# Patient Record
Sex: Male | Born: 1995 | Race: White | Hispanic: No | Marital: Single | Smoking: Former smoker
Health system: Southern US, Community
[De-identification: ages and names within clinical notes are randomized; demographics above are authoritative.]

## PROBLEM LIST (undated history)

## (undated) DIAGNOSIS — J45909 Unspecified asthma, uncomplicated: Secondary | ICD-10-CM

## (undated) DIAGNOSIS — T7840XA Allergy, unspecified, initial encounter: Secondary | ICD-10-CM

## (undated) HISTORY — PX: NO PAST SURGERIES: SHX2092

## (undated) HISTORY — DX: Unspecified asthma, uncomplicated: J45.909

## (undated) HISTORY — DX: Allergy, unspecified, initial encounter: T78.40XA

---

## 2018-01-19 ENCOUNTER — Other Ambulatory Visit: Payer: Self-pay | Admitting: Internal Medicine

## 2018-01-19 DIAGNOSIS — R109 Unspecified abdominal pain: Secondary | ICD-10-CM

## 2018-01-19 DIAGNOSIS — R319 Hematuria, unspecified: Secondary | ICD-10-CM

## 2018-01-22 ENCOUNTER — Ambulatory Visit
Admission: RE | Admit: 2018-01-22 | Discharge: 2018-01-22 | Disposition: A | Discharge status: Home / Self Care | Payer: 59 | Source: Ambulatory Visit | Attending: Internal Medicine | Admitting: Internal Medicine

## 2018-01-22 DIAGNOSIS — R109 Unspecified abdominal pain: Secondary | ICD-10-CM

## 2018-01-22 DIAGNOSIS — R319 Hematuria, unspecified: Secondary | ICD-10-CM

## 2018-10-12 DIAGNOSIS — Z20828 Contact with and (suspected) exposure to other viral communicable diseases: Secondary | ICD-10-CM | POA: Diagnosis not present

## 2018-10-23 DIAGNOSIS — R030 Elevated blood-pressure reading, without diagnosis of hypertension: Secondary | ICD-10-CM | POA: Diagnosis not present

## 2018-10-23 DIAGNOSIS — R05 Cough: Secondary | ICD-10-CM | POA: Diagnosis not present

## 2018-11-11 DIAGNOSIS — Z20828 Contact with and (suspected) exposure to other viral communicable diseases: Secondary | ICD-10-CM | POA: Diagnosis not present

## 2018-11-27 DIAGNOSIS — J0100 Acute maxillary sinusitis, unspecified: Secondary | ICD-10-CM | POA: Diagnosis not present

## 2018-11-27 DIAGNOSIS — H65111 Acute and subacute allergic otitis media (mucoid) (sanguinous) (serous), right ear: Secondary | ICD-10-CM | POA: Diagnosis not present

## 2018-11-27 DIAGNOSIS — R0982 Postnasal drip: Secondary | ICD-10-CM | POA: Diagnosis not present

## 2019-01-25 ENCOUNTER — Other Ambulatory Visit: Payer: Self-pay

## 2019-01-25 DIAGNOSIS — Z20822 Contact with and (suspected) exposure to covid-19: Secondary | ICD-10-CM

## 2019-01-27 LAB — NOVEL CORONAVIRUS, NAA: SARS-CoV-2, NAA: DETECTED — AB

## 2019-02-05 DIAGNOSIS — Z20828 Contact with and (suspected) exposure to other viral communicable diseases: Secondary | ICD-10-CM | POA: Diagnosis not present

## 2019-02-07 DIAGNOSIS — Z20828 Contact with and (suspected) exposure to other viral communicable diseases: Secondary | ICD-10-CM | POA: Diagnosis not present

## 2019-02-19 DIAGNOSIS — Z20828 Contact with and (suspected) exposure to other viral communicable diseases: Secondary | ICD-10-CM | POA: Diagnosis not present

## 2019-03-16 DIAGNOSIS — Z20828 Contact with and (suspected) exposure to other viral communicable diseases: Secondary | ICD-10-CM | POA: Diagnosis not present

## 2019-05-17 DIAGNOSIS — Z20828 Contact with and (suspected) exposure to other viral communicable diseases: Secondary | ICD-10-CM | POA: Diagnosis not present

## 2019-06-28 ENCOUNTER — Ambulatory Visit: Payer: Self-pay | Attending: Internal Medicine

## 2019-06-28 DIAGNOSIS — Z23 Encounter for immunization: Secondary | ICD-10-CM

## 2019-06-28 NOTE — Progress Notes (Signed)
   Covid-19 Vaccination Clinic  Name:  Alanmichael Barmore    MRN: [id number] DOB: 1995-09-19  06/28/2019  Mr. Elting was observed post Covid-19 immunization for 15 minutes without incident. He was provided with Vaccine Information Sheet and instruction to access the V-Safe system.   Mr. Strauch was instructed to call 911 with any severe reactions post vaccine: Marland Kitchen Difficulty breathing  . Swelling of face and throat  . A fast heartbeat  . A bad rash all over body  . Dizziness and weakness   Immunizations Administered    Name Date Dose VIS Date Route   Pfizer COVID-19 Vaccine 06/28/2019  9:35 AM 0.3 mL 03/29/2019 Intramuscular   Manufacturer: [employer], [employer]   Lot: [id number]   NDC: [address]-1

## 2019-07-11 DIAGNOSIS — L03012 Cellulitis of left finger: Secondary | ICD-10-CM | POA: Diagnosis not present

## 2019-07-23 ENCOUNTER — Ambulatory Visit: Payer: Self-pay | Attending: Internal Medicine

## 2019-07-23 DIAGNOSIS — Z23 Encounter for immunization: Secondary | ICD-10-CM

## 2019-07-23 NOTE — Progress Notes (Signed)
   Covid-19 Vaccination Clinic  Name:  Calvin Guerrero    MRN: [id number] DOB: 1996-01-22  07/23/2019  Calvin Guerrero was observed post Covid-19 immunization for 15 minutes without incident. He was provided with Vaccine Information Sheet and instruction to access the V-Safe system.   Calvin Guerrero was instructed to call 911 with any severe reactions post vaccine: Marland Kitchen Difficulty breathing  . Swelling of face and throat  . A fast heartbeat  . A bad rash all over body  . Dizziness and weakness   Immunizations Administered    Name Date Dose VIS Date Route   Pfizer COVID-19 Vaccine 07/23/2019  1:05 PM 0.3 mL 03/29/2019 Intramuscular   Manufacturer: [employer], [employer]   Lot: [id number]   NDC: [address]-2

## 2019-10-17 DIAGNOSIS — Z03818 Encounter for observation for suspected exposure to other biological agents ruled out: Secondary | ICD-10-CM | POA: Diagnosis not present

## 2019-10-17 DIAGNOSIS — Z20822 Contact with and (suspected) exposure to covid-19: Secondary | ICD-10-CM | POA: Diagnosis not present

## 2019-11-19 DIAGNOSIS — A545 Gonococcal pharyngitis: Secondary | ICD-10-CM | POA: Diagnosis not present

## 2019-12-10 ENCOUNTER — Ambulatory Visit: Payer: Self-pay | Admitting: Nurse Practitioner

## 2019-12-17 ENCOUNTER — Encounter: Payer: Self-pay | Admitting: Physician Assistant

## 2019-12-17 ENCOUNTER — Encounter: Payer: Self-pay | Admitting: Gastroenterology

## 2019-12-17 ENCOUNTER — Other Ambulatory Visit: Payer: Self-pay

## 2019-12-17 ENCOUNTER — Ambulatory Visit (INDEPENDENT_AMBULATORY_CARE_PROVIDER_SITE_OTHER): Payer: BC Managed Care – PPO | Admitting: Physician Assistant

## 2019-12-17 VITALS — BP 126/70 | HR 84 | Temp 98.0°F | Ht 72.5 in | Wt 225.0 lb

## 2019-12-17 DIAGNOSIS — R194 Change in bowel habit: Secondary | ICD-10-CM

## 2019-12-17 DIAGNOSIS — Z114 Encounter for screening for human immunodeficiency virus [HIV]: Secondary | ICD-10-CM | POA: Diagnosis not present

## 2019-12-17 DIAGNOSIS — R41840 Attention and concentration deficit: Secondary | ICD-10-CM | POA: Diagnosis not present

## 2019-12-17 DIAGNOSIS — Z1159 Encounter for screening for other viral diseases: Secondary | ICD-10-CM | POA: Diagnosis not present

## 2019-12-17 DIAGNOSIS — Z23 Encounter for immunization: Secondary | ICD-10-CM | POA: Diagnosis not present

## 2019-12-17 DIAGNOSIS — R1084 Generalized abdominal pain: Secondary | ICD-10-CM | POA: Diagnosis not present

## 2019-12-17 DIAGNOSIS — F419 Anxiety disorder, unspecified: Secondary | ICD-10-CM

## 2019-12-17 NOTE — Progress Notes (Signed)
Calvin Guerrero is a 24 y.o. male is here to establish care.  I acted as a Neurosurgeon for [employer], PA-C Corky Mull, LPN   History of Present Illness:   Chief Complaint  Patient presents with  . Establish Care  . Anxiety  . Depression  . Abdominal Pain    HPI   Anxiety/Depression Pt c/o issues for the past 3-4 yrs and has worsened in the past months due to pandemic and subsequent isolation. Pt says he is having a lot of anxiety, feels scatter brain. Has concerns about ADD and ADHD -- has never been formally diagnosed. Also endorses depression, states that he is sad a lot lately and has self doubt. Struggling to figure out what he wants to do with life. Denies SI/HI.  Has never been on medication or seen a therapist.   Abdominal pain Pt c/o upper abdominal pain for the past 7-8 yrs, worse past few months. Pt has been trying to figure out what foods bother him. Pt is having bouts of diarrhea. Definitely feels as though he has lactose intolerance. Denies abdominal pain that wakes him up, rectal bleeding, nausea, family history of colon cancer, unintentional weight loss.    Health Maintenance Due  Topic Date Due  . Hepatitis C Screening  Never done  . HIV Screening  Never done  . TETANUS/TDAP  Never done    Past Medical History:  Diagnosis Date  . Allergy   . Asthma    as a child     Social History   Tobacco Use  . Smoking status: Former Smoker    Types: Cigarettes  . Smokeless tobacco: Never Used  . Tobacco comment: quit 04/2018  Vaping Use  . Vaping Use: Never used  Substance Use Topics  . Alcohol use: Yes    Comment: socially  . Drug use: Yes    Frequency: 4.0 times per week    Types: Marijuana    History reviewed. No pertinent surgical history.  Family History  Problem Relation Age of Onset  . Asthma Mother   . Thyroid cancer Mother   . Gestational diabetes Mother   . Depression Mother   . Hearing loss Father   . Depression Sister   . Ovarian  cancer Paternal Grandmother   . Colon cancer Neg Hx   . Prostate cancer Neg Hx   . Breast cancer Neg Hx     PMHx, SurgHx, SocialHx, FamHx, Medications, and Allergies were reviewed in the Visit Navigator and updated as appropriate.   There are no problems to display for this patient.   Social History   Tobacco Use  . Smoking status: Former Smoker    Types: Cigarettes  . Smokeless tobacco: Never Used  . Tobacco comment: quit 04/2018  Vaping Use  . Vaping Use: Never used  Substance Use Topics  . Alcohol use: Yes    Comment: socially  . Drug use: Yes    Frequency: 4.0 times per week    Types: Marijuana    Current Medications and Allergies:    Current Outpatient Medications:  .  bismuth subsalicylate (PEPTO BISMOL) 262 MG chewable tablet, Chew 524 mg by mouth as needed., Disp: , Rfl:  .  cetirizine (ZYRTEC) 10 MG tablet, Take by mouth., Disp: , Rfl:  .  DESCOVY 200-25 MG tablet, Take 1 tablet by mouth daily., Disp: , Rfl:  .  naproxen sodium (ALEVE) 220 MG tablet, Take 220 mg by mouth daily as needed., Disp: , Rfl:  No Known Allergies  Review of Systems   ROS Negative unless otherwise specified per HPI.  Vitals:   Vitals:   12/17/19 0917  BP: 126/70  Pulse: 84  Temp: 98 F (36.7 C)  TempSrc: Temporal  SpO2: 98%  Weight: 225 lb (102.1 kg)  Height: 6' 0.5" (1.842 m)     Body mass index is 30.1 kg/m.   Physical Exam:    Physical Exam Vitals and nursing note reviewed.  Constitutional:      General: He is not in acute distress.    Appearance: He is well-developed. He is not ill-appearing or toxic-appearing.  Cardiovascular:     Rate and Rhythm: Normal rate and regular rhythm.     Pulses: Normal pulses.     Heart sounds: Normal heart sounds, S1 normal and S2 normal.     Comments: No LE edema Pulmonary:     Effort: Pulmonary effort is normal.     Breath sounds: Normal breath sounds.  Abdominal:     General: Abdomen is flat. Bowel sounds are normal.      Palpations: Abdomen is soft.     Tenderness: There is no abdominal tenderness.  Skin:    General: Skin is warm and dry.  Neurological:     Mental Status: He is alert.     GCS: GCS eye subscore is 4. GCS verbal subscore is 5. GCS motor subscore is 6.  Psychiatric:        Speech: Speech normal.        Behavior: Behavior normal. Behavior is cooperative.      Assessment and Plan:    Jerrel was seen today for establish care, anxiety, depression and abdominal pain.  Diagnoses and all orders for this visit:  Generalized abdominal pain; Bowel habit changes Suspect possible IBS and dietary intolerances however cannot r/o other causes -- will refer to GI for further evaluation and management. -     CBC with Differential/Platelet; Future -     Comprehensive metabolic panel; Future -     TSH; Future -     Ambulatory referral to Gastroenterology  Anxiety; Attention deficit Suspect possible undiagnosed ADD/ADHD. Will wait to treat anxiety/depression depending on ADD/ADHD results. Will send for evaluation, consider [facility] if therapist unable to evaluate for ADD/ADHD. -     Ambulatory referral to Psychology -     CBC with Differential/Platelet; Future -     Comprehensive metabolic panel; Future -     TSH; Future -     Ambulatory referral to Psychology  Encounter for screening for other viral diseases -     Hepatitis C antibody; Future  Screening for HIV (human immunodeficiency virus) -     HIV Antibody (routine testing w rflx); Future   . Reviewed expectations re: course of current medical issues. . Discussed self-management of symptoms. . Outlined signs and symptoms indicating need for more acute intervention. . Patient verbalized understanding and all questions were answered. . See orders for this visit as documented in the electronic medical record. . Patient received an After Visit Summary.  CMA or LPN served as scribe during this visit. History,  Physical, and Plan performed by medical provider. The above documentation has been reviewed and is accurate and complete.   Jarold Motto, PA-C [place] [place] 12/17/2019  Follow-up: No follow-ups on file.

## 2019-12-17 NOTE — Patient Instructions (Signed)
It was great to see you!  We will update you with your lab results.  I will have [facility] reach out to you to schedule talk therapy -- with Hal Neer.  Referral for GI will be sent.  Take care,  Jarold Motto PA-C

## 2019-12-17 NOTE — Addendum Note (Signed)
Addended by: Jimmye Norman on: 12/17/2019 10:44 AM   Modules accepted: Orders

## 2019-12-19 LAB — COMPREHENSIVE METABOLIC PANEL
AG Ratio: 1.7 (calc) (ref 1.0–2.5)
ALT: 23 U/L (ref 9–46)
AST: 20 U/L (ref 10–40)
Albumin: 4.7 g/dL (ref 3.6–5.1)
Alkaline phosphatase (APISO): 64 U/L (ref 36–130)
BUN: 12 mg/dL (ref 7–25)
CO2: 24 mmol/L (ref 20–32)
Calcium: 9.6 mg/dL (ref 8.6–10.3)
Chloride: 105 mmol/L (ref 98–110)
Creat: 0.77 mg/dL (ref 0.60–1.35)
Globulin: 2.7 g/dL (calc) (ref 1.9–3.7)
Glucose, Bld: 97 mg/dL (ref 65–99)
Potassium: 4.1 mmol/L (ref 3.5–5.3)
Sodium: 140 mmol/L (ref 135–146)
Total Bilirubin: 0.7 mg/dL (ref 0.2–1.2)
Total Protein: 7.4 g/dL (ref 6.1–8.1)

## 2019-12-19 LAB — CBC WITH DIFFERENTIAL/PLATELET
Absolute Monocytes: 432 cells/uL (ref 200–950)
Basophils Absolute: 62 cells/uL (ref 0–200)
Basophils Relative: 1.3 %
Eosinophils Absolute: 91 cells/uL (ref 15–500)
Eosinophils Relative: 1.9 %
HCT: 44.8 % (ref 38.5–50.0)
Hemoglobin: 15.2 g/dL (ref 13.2–17.1)
Lymphs Abs: 1560 cells/uL (ref 850–3900)
MCH: 30.2 pg (ref 27.0–33.0)
MCHC: 33.9 g/dL (ref 32.0–36.0)
MCV: 88.9 fL (ref 80.0–100.0)
MPV: 9.8 fL (ref 7.5–12.5)
Monocytes Relative: 9 %
Neutro Abs: 2654 cells/uL (ref 1500–7800)
Neutrophils Relative %: 55.3 %
Platelets: 236 10*3/uL (ref 140–400)
RBC: 5.04 10*6/uL (ref 4.20–5.80)
RDW: 13 % (ref 11.0–15.0)
Total Lymphocyte: 32.5 %
WBC: 4.8 10*3/uL (ref 3.8–10.8)

## 2019-12-19 LAB — HEPATITIS C ANTIBODY
Hepatitis C Ab: NONREACTIVE
SIGNAL TO CUT-OFF: 0.02 (ref <1.00)

## 2019-12-19 LAB — HIV ANTIBODY (ROUTINE TESTING W REFLEX): HIV 1&2 Ab, 4th Generation: NONREACTIVE

## 2019-12-19 LAB — TSH: TSH: 2.25 mIU/L (ref 0.40–4.50)

## 2019-12-25 DIAGNOSIS — Z20822 Contact with and (suspected) exposure to covid-19: Secondary | ICD-10-CM | POA: Diagnosis not present

## 2020-01-12 DIAGNOSIS — Z20822 Contact with and (suspected) exposure to covid-19: Secondary | ICD-10-CM | POA: Diagnosis not present

## 2020-01-28 ENCOUNTER — Ambulatory Visit (INDEPENDENT_AMBULATORY_CARE_PROVIDER_SITE_OTHER): Payer: BC Managed Care – PPO | Admitting: Gastroenterology

## 2020-01-28 ENCOUNTER — Other Ambulatory Visit (INDEPENDENT_AMBULATORY_CARE_PROVIDER_SITE_OTHER): Payer: BC Managed Care – PPO

## 2020-01-28 ENCOUNTER — Encounter: Payer: Self-pay | Admitting: Gastroenterology

## 2020-01-28 VITALS — BP 128/74 | HR 72 | Ht 72.5 in | Wt 227.6 lb

## 2020-01-28 DIAGNOSIS — R194 Change in bowel habit: Secondary | ICD-10-CM

## 2020-01-28 DIAGNOSIS — R1084 Generalized abdominal pain: Secondary | ICD-10-CM | POA: Diagnosis not present

## 2020-01-28 DIAGNOSIS — R142 Eructation: Secondary | ICD-10-CM | POA: Diagnosis not present

## 2020-01-28 LAB — C-REACTIVE PROTEIN: CRP: <1 mg/dL (ref 0.5–20.0)

## 2020-01-28 LAB — SEDIMENTATION RATE: Sed Rate: 16 mm/hr — ABNORMAL HIGH (ref 0–15)

## 2020-01-28 MED ORDER — HYOSCYAMINE SULFATE 0.125 MG SL SUBL: 0.1250 mg | SUBLINGUAL_TABLET | Freq: Four times a day (QID) | SUBLINGUAL | 1 refills | Status: AC | PRN

## 2020-01-28 MED ORDER — PANTOPRAZOLE SODIUM 40 MG PO TBEC: 40.0000 mg | DELAYED_RELEASE_TABLET | Freq: Every day | ORAL | 5 refills | Status: DC

## 2020-01-28 NOTE — Progress Notes (Signed)
01/28/2020 Calvin Guerrero [id number] 1995/12/02   HISTORY OF PRESENT ILLNESS: This is a 24 year old male who is new to our office.  He has been referred here by his PCP, Jarold Motto, PA-C, for evaluation regarding generalized abdominal pain and altered bowel habits.  He tells me that these symptoms have been going on for a few years.  He thinks they are related to stress and anxiety and he has kind of diagnosed himself with IBS.  He says that he does not have the best diet so he thinks a lot of times that it is related to his less than optimal eating habits.  He says that about 25% of the time he has diarrhea or loose stools.  He says that on average he has about 2-3 bowel movements a day.  Depending what he eats sometimes he has the urge to move his bowels shortly after eating.  He reports generalized abdominal discomfort and gassy sensation, but no fixed abdominal pain.  Has occasional constipation.  Denies rectal bleeding.  Uses Pepto-Bismol as needed.  No family history of GI diseases to his knowledge.  Had a recent CBC, CMP, TSH that were normal.   Past Medical History:  Diagnosis Date  . Allergy   . Asthma    as a child   Past Surgical History:  Procedure Laterality Date  . NO PAST SURGERIES      reports that he has quit smoking. His smoking use included cigarettes. He has never used smokeless tobacco. He reports current alcohol use. He reports current drug use. Frequency: 4.00 times per week. Drug: Marijuana. family history includes Asthma in his mother; Depression in his mother and sister; Gestational diabetes in his mother; Hearing loss in his father; Ovarian cancer in his paternal grandmother; Thyroid cancer in his mother. No Known Allergies    Outpatient Encounter Medications as of 01/28/2020  Medication Sig  . bismuth subsalicylate (PEPTO BISMOL) 262 MG chewable tablet Chew 524 mg by mouth as needed.  . cetirizine (ZYRTEC) 10 MG tablet Take by mouth.  . DESCOVY 200-25 MG  tablet Take 1 tablet by mouth daily.  . naproxen sodium (ALEVE) 220 MG tablet Take 220 mg by mouth daily as needed.   No facility-administered encounter medications on file as of 01/28/2020.    REVIEW OF SYSTEMS  : All other systems reviewed and negative except where noted in the History of Present Illness.  PHYSICAL EXAM: BP 128/74   Pulse 72   Ht 6' 0.5" (1.842 m)   Wt 227 lb 9.6 oz (103.2 kg)   BMI 30.44 kg/m  General: Well developed white male in no acute distress Head: Normocephalic and atraumatic Eyes:  Sclerae anicteric, conjunctiva pink. Ears: Normal auditory acuity Lungs: Clear throughout to auscultation; no W/R/R. Heart: Regular rate and rhythm; no M/R/G. Abdomen: Soft, non-distended.  BS present.  Non-tender. Musculoskeletal: Symmetrical with no gross deformities  Skin: No lesions on visible extremities Extremities: No edema  Neurological: Alert oriented x 4, grossly non-focal Psychological:  Alert and cooperative. Normal mood and affect  ASSESSMENT AND PLAN: *24 year old male with longstanding complaints of generalized abdominal discomfort typically after eating, altered bowel habits with sometimes loose stools/diarrhea, occasional constipation.  Also reports belching and sensation of reflux at times as well.  Has really self-diagnosed with IBS as he thinks a lot of this is related to stress and anxiety.  This very well may be the case.  He does not have any red flag symptoms.  Recent  CBC, CMP, TSH were normal.  We will check celiac labs, sed rate, CRP.  He will try lactose-free diet and this literature was given to him.  We will start pantoprazole 40 mg daily for possible acid reflux related issues and will start Levsin as needed for abdominal pain/cramping.  Both those prescriptions were sent to his pharmacy.  He will follow-up with me in about 4 to 5 weeks at which time we will reassess need for endoscopic procedures, etc.   CC:  Jarold Motto, PA

## 2020-01-28 NOTE — Patient Instructions (Addendum)
If you are age 24 or older, your body mass index should be between 23-30. Your Body mass index is 30.44 kg/m. If this is out of the aforementioned range listed, please consider follow up with your Primary Care Provider.  If you are age 29 or younger, your body mass index should be between 19-25. Your Body mass index is 30.44 kg/m. If this is out of the aformentioned range listed, please consider follow up with your Primary Care Provider.   Your provider has requested that you go to the basement level for lab work before leaving today. Press "B" on the elevator. The lab is located at the first door on the left as you exit the elevator.  We have sent the following medications to your pharmacy for you to pick up at your convenience: Pantoprazole 40 mg daily 30-60 minutes before breakfast. Levsin 0.125 mg SL every 6 hours as needed.   Lactose Free Diet handout provided.

## 2020-01-29 LAB — TISSUE TRANSGLUTAMINASE ABS,IGG,IGA
(tTG) Ab, IgA: <1 U/mL
(tTG) Ab, IgG: <1 U/mL

## 2020-01-29 LAB — IGA: Immunoglobulin A: 302 mg/dL (ref 47–310)

## 2020-02-03 NOTE — Progress Notes (Signed)
Agree with the assessment and plan as outlined by Doug Sou, PA-C. If further evaluation unrevealing, and no response ot conservative measures as outlined, will plan for endoscopic evaluation.   Mavryk Pino, DO, [facility]

## 2020-03-03 ENCOUNTER — Encounter: Payer: Self-pay | Admitting: Gastroenterology

## 2020-03-03 ENCOUNTER — Ambulatory Visit (INDEPENDENT_AMBULATORY_CARE_PROVIDER_SITE_OTHER): Payer: BC Managed Care – PPO | Admitting: Gastroenterology

## 2020-03-03 VITALS — BP 112/60 | HR 80 | Ht 72.0 in | Wt 228.4 lb

## 2020-03-03 DIAGNOSIS — R194 Change in bowel habit: Secondary | ICD-10-CM | POA: Diagnosis not present

## 2020-03-03 DIAGNOSIS — R1084 Generalized abdominal pain: Secondary | ICD-10-CM

## 2020-03-03 DIAGNOSIS — R142 Eructation: Secondary | ICD-10-CM | POA: Diagnosis not present

## 2020-03-03 NOTE — Progress Notes (Signed)
03/03/2020 Calvin Guerrero [id number] [birth year]   HISTORY OF PRESENT ILLNESS: This is a 24 year old male who is here for follow-up of his reflux and suspected IBS symptoms.  He was seen by me on October 12 for complaints of generalized abdominal pain and altered bowel habits as well as acid reflux type symptoms.  He had self diagnosed with IBS.  We ran several labs including celiac labs, sed rate, CRP and he had already undergone CBC, CMP, TSH.  All of those were normal except for a sed rate of 16.  He was placed on pantoprazole 40 mg daily and Levsin as needed.  He returns here today for follow-up.  He says that he is doing much better.  Reports only having about 1-2 bowel movements a day.  He has not completely gone lactose-free, but definitely limits his dairy intake.  His abdominal pains are also much better and his reflux has significantly improved.   Past Medical History:  Diagnosis Date  . Allergy   . Asthma    as a child   Past Surgical History:  Procedure Laterality Date  . NO PAST SURGERIES      reports that he has quit smoking. His smoking use included cigarettes. He has never used smokeless tobacco. He reports current alcohol use. He reports current drug use. Frequency: 4.00 times per week. Drug: Marijuana. family history includes Asthma in his mother; Depression in his mother and sister; Gestational diabetes in his mother; Hearing loss in his father; Ovarian cancer in his paternal grandmother; Thyroid cancer in his mother. No Known Allergies    Outpatient Encounter Medications as of 03/03/2020  Medication Sig  . bismuth subsalicylate (PEPTO BISMOL) 262 MG chewable tablet Chew 524 mg by mouth as needed.  . cetirizine (ZYRTEC) 10 MG tablet Take by mouth.  . DESCOVY 200-25 MG tablet Take 1 tablet by mouth daily.  . hyoscyamine (LEVSIN SL) 0.125 MG SL tablet Place 1 tablet (0.125 mg total) under the tongue every 6 (six) hours as needed.  . naproxen sodium (ALEVE) 220 MG  tablet Take 220 mg by mouth daily as needed.  . pantoprazole (PROTONIX) 40 MG tablet Take 1 tablet (40 mg total) by mouth daily.   No facility-administered encounter medications on file as of 03/03/2020.     REVIEW OF SYSTEMS  : All other systems reviewed and negative except where noted in the History of Present Illness.   PHYSICAL EXAM: Ht 6' (1.829 m) Comment: height measured without shoes  Wt 228 lb 6 oz (103.6 kg)   BMI 30.97 kg/m  General: Well developed white male in no acute distress Head: Normocephalic and atraumatic Eyes:  Sclerae anicteric, conjunctiva pink. Ears: Normal auditory acuity Lungs: Clear throughout to auscultation; no W/R/R. Heart: Regular rate and rhythm; no M/R/G. Abdomen: Soft, non-distended.  BS present.  Non-tender. Musculoskeletal: Symmetrical with no gross deformities  Skin: No lesions on visible extremities Extremities: No edema  Neurological: Alert oriented x 4, grossly non-focal Psychological:  Alert and cooperative. Normal mood and affect  ASSESSMENT AND PLAN: *24 year old male with longstanding complaints of generalized abdominal discomfort typically after eating, altered bowel habits with sometimes loose stools/diarrhea, occasional constipation.  Also reported belching and sensation reflux at times.  Suspected diagnoses of IBS and GERD.  All of his labs were normal except a sed rate elevated at only 16.  He was started on pantoprazole 40 mg daily and Levsin as needed.  He reports doing much better on this regimen.  Patient is that he is satisfied on his regimen at this time and would like to continue it longer and declines endoscopic evaluation for now.  He will return prn if symptoms worsen or other issues occur, otherwise we will see him in one year for refills of his medications.   CC:  Jarold Motto, [place]

## 2020-03-03 NOTE — Progress Notes (Signed)
Agree with the assessment and plan as outlined by Jessica Zehr, PA-C. ? ?Lilya Smitherman, DO, FACG ? ?

## 2020-03-03 NOTE — Patient Instructions (Signed)
Please follow up as needed 

## 2020-03-06 ENCOUNTER — Encounter: Payer: BC Managed Care – PPO | Admitting: Physician Assistant

## 2020-03-21 DIAGNOSIS — Z20822 Contact with and (suspected) exposure to covid-19: Secondary | ICD-10-CM | POA: Diagnosis not present

## 2020-03-24 IMAGING — CT CT ABD-PELV W/O CM
2 of 4 series · 13 of 46 positions shown, 15 images · non-contrast
Comparison: None.

CLINICAL DATA: 22-year-old male with bilateral groin pain and left
flank pain. Microscopic hematuria. Initial encounter.

EXAM:
CT ABDOMEN AND PELVIS WITHOUT CONTRAST
TECHNIQUE: Multidetector CT imaging of the abdomen and pelvis was performed
following the standard protocol without IV contrast.

[Series 2: renal stone 5.00 br40 s3 ax · axial · 0.65mm/px · z∈[+1223,+1668]mm · 10 of 107 slices shown, 12 images]
[im 9/107  soft-tissue]
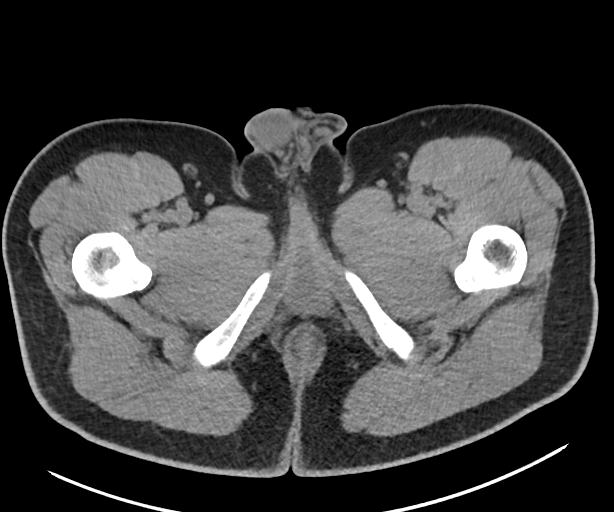
[im 9/107  bone]
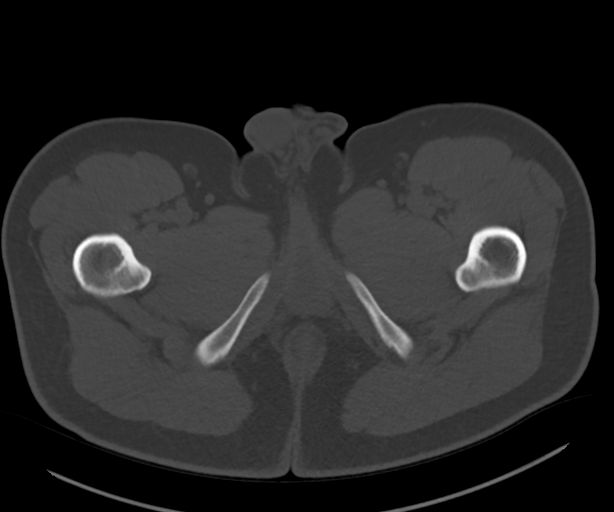
[im 17/107  soft-tissue]
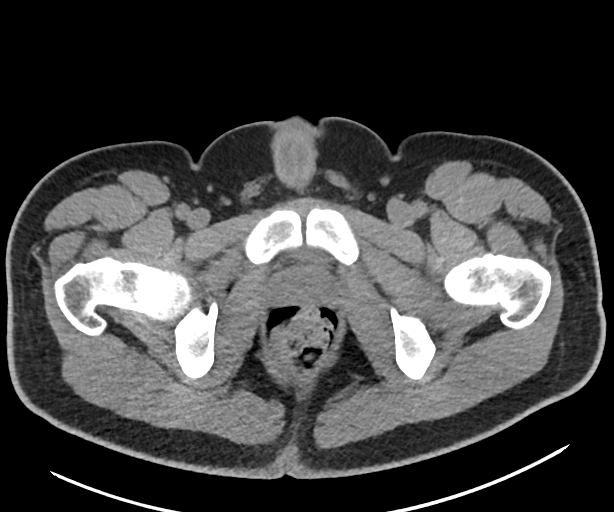
[im 30/107  soft-tissue]
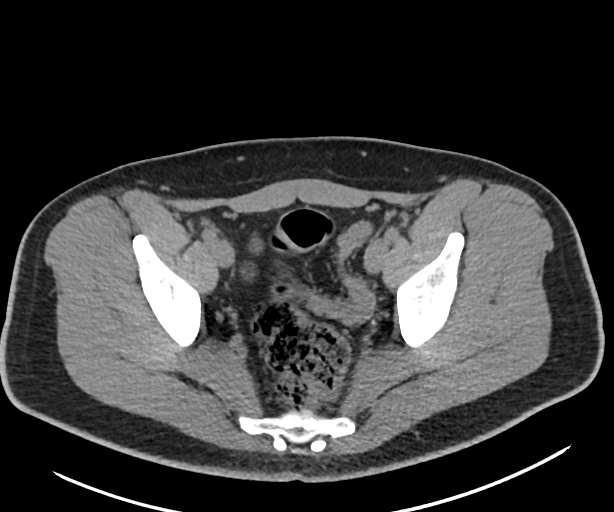
[im 39/107  soft-tissue]
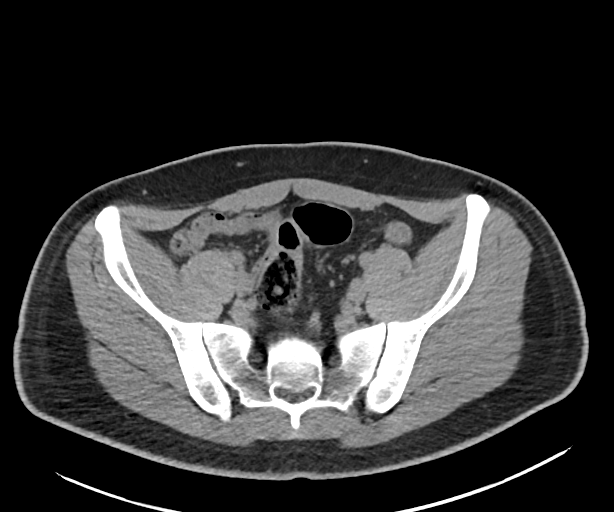
[im 47/107  soft-tissue]
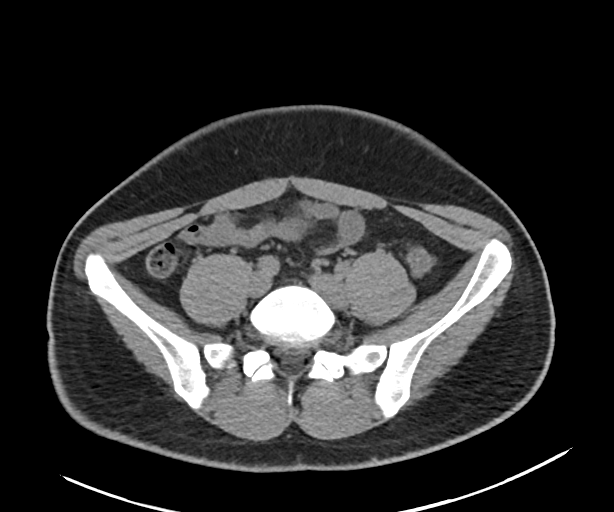
[im 60/107  soft-tissue]
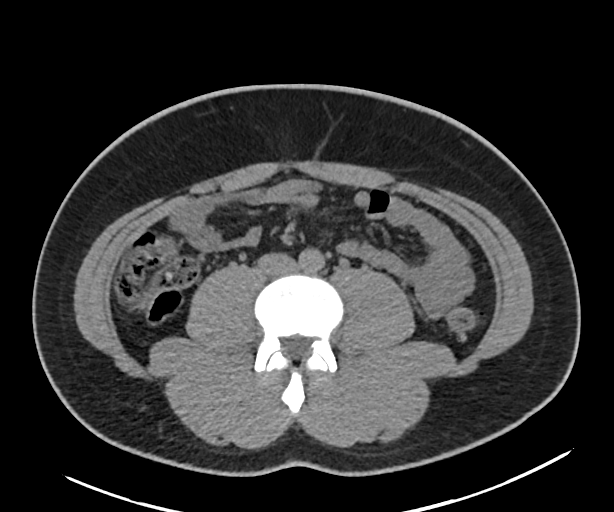
[im 68/107  soft-tissue]
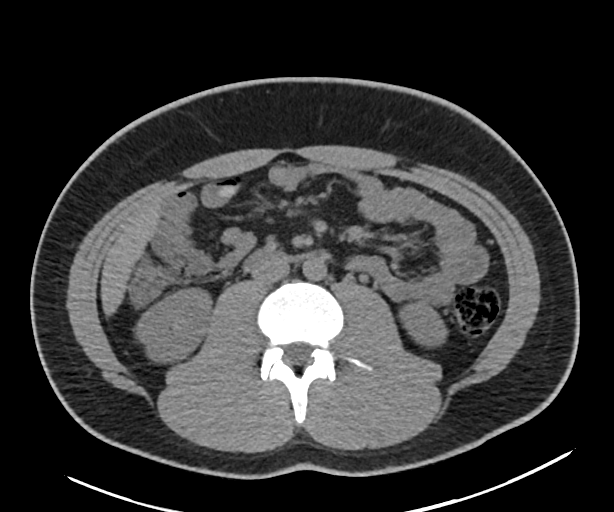
[im 81/107  soft-tissue]
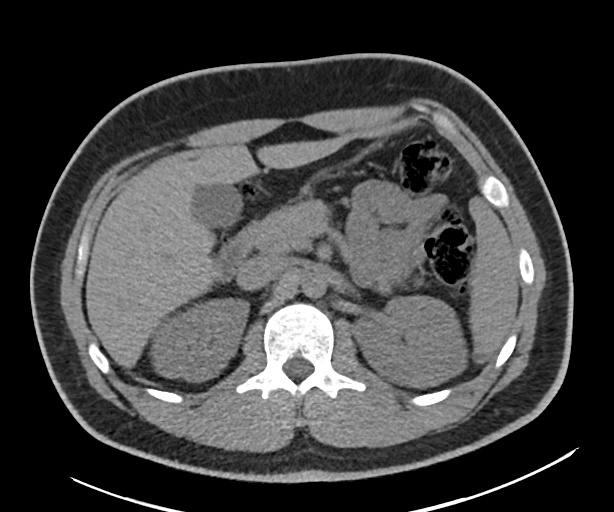
[im 90/107  soft-tissue]
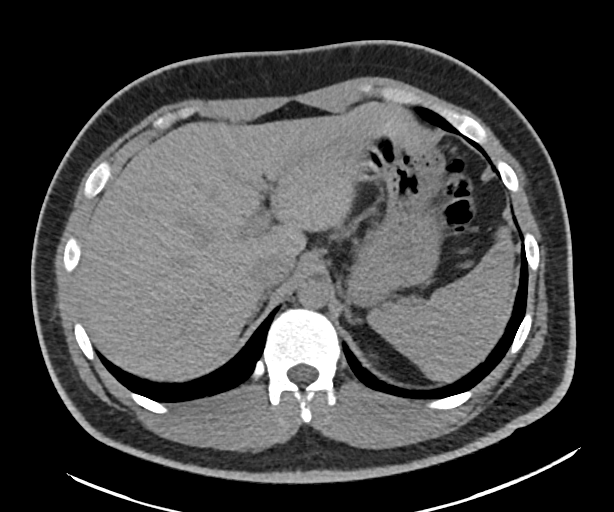
[im 90/107  bone]
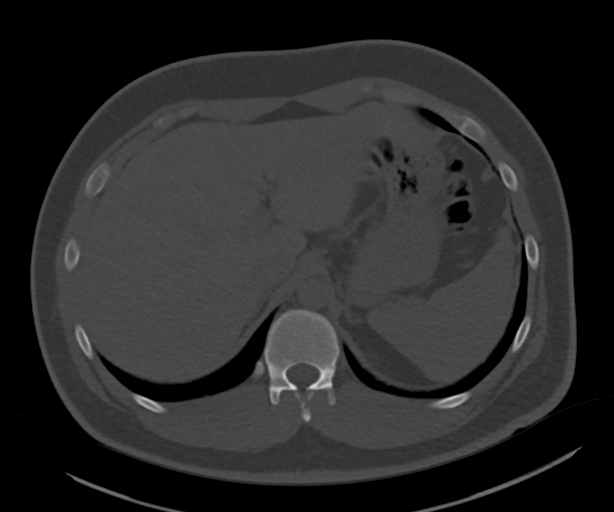
[im 98/107  soft-tissue]
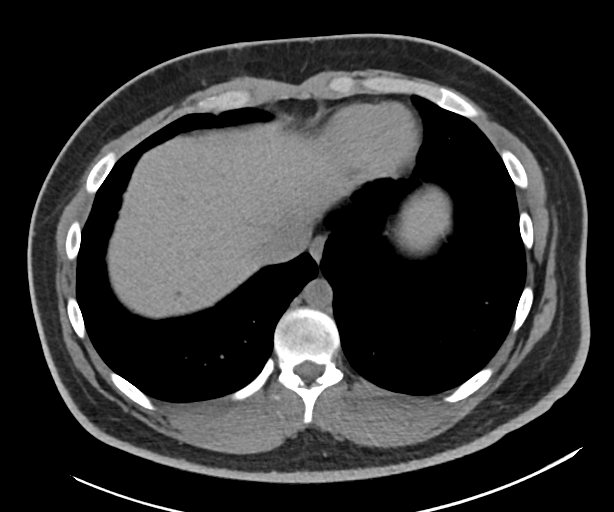

[Series 6: renal stone 2.00 br40 s3 cor · coronal · 0.78mm/px · 3 of 172 slices shown]
[im 58/172  soft-tissue]
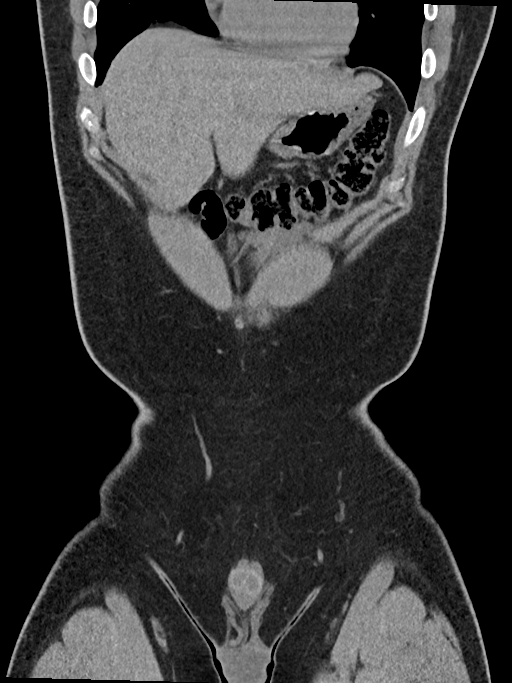
[im 77/172  soft-tissue]
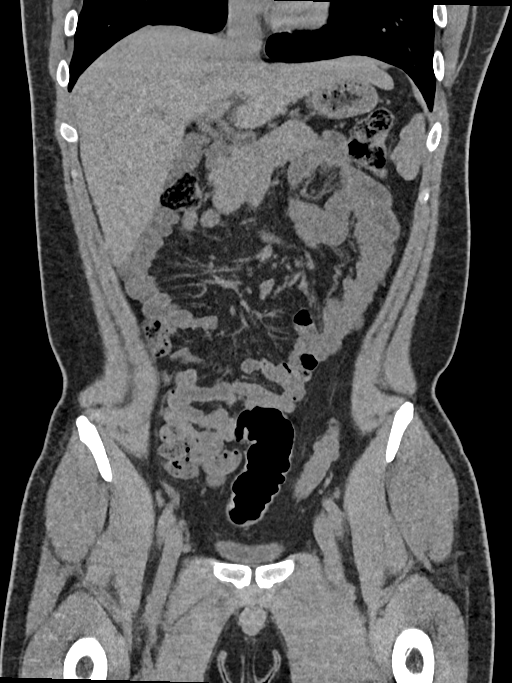
[im 96/172  soft-tissue]
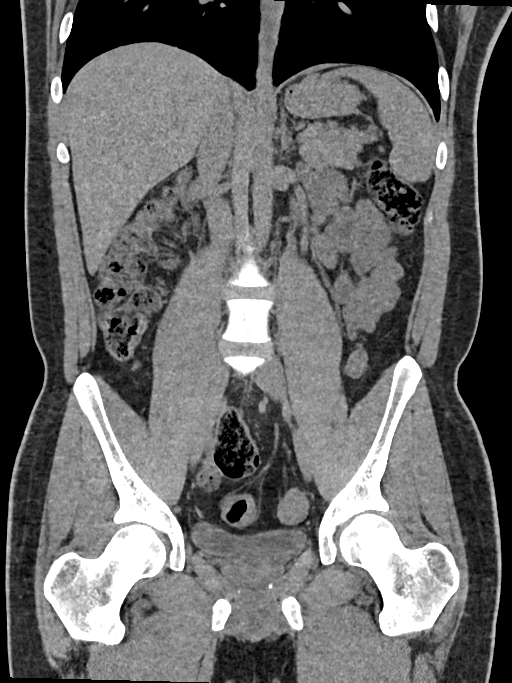

[13 of 46 positions shown; findings below may reference images not displayed]

FINDINGS: Lower chest: No worrisome lung base abnormality. Heart size within
normal limits.

Hepatobiliary: Slightly elongated liver spanning over 18.7 cm.
Taking into account limitation by non contrast imaging, no worrisome
hepatic lesion. No calcified gallstones or CT evidence of
gallbladder inflammation.

Pancreas: Taking into account limitation by non contrast imaging, no
worrisome pancreatic mass or inflammation.

Spleen: Taking into account limitation by non contrast imaging, no
splenic mass or enlargement.

Adrenals/Urinary Tract: 2 x 1 mm calcification left aspect of the
pelvis (series 2, image 78 and series 6, image 109). Question tiny
distal left ureteral calculus without proximal obstruction as versus
tiny phlebolith. This is located 5.8 cm proximal to the left
ureteral vesical junction.

Taking into account limitation by non contrast imaging, no worrisome
renal or adrenal lesion. Artifact versus tiny left upper pole
nonobstructing renal calculi.

Decompressed urinary bladder without gross abnormality.

Stomach/Bowel: Portions of stomach, small bowel and colon under
distended. No extraluminal bowel inflammatory process detected.
Specifically, no inflammation surrounds the appendix.

Vascular/Lymphatic: No abdominal aortic aneurysm. Scattered small
lymph nodes.

Reproductive: Coarse calcification left aspect of prostate gland.

Other: No free air or bowel containing hernia.

Musculoskeletal: No worrisome osseous abnormality.
IMPRESSION: 1. 2 x 1 mm calcification left aspect of the pelvis (series 2, image
78 and series 6, image 109). Question tiny distal left ureteral
calculus without proximal obstruction as versus tiny phlebolith.
This is located 5.8 cm proximal to the left ureteral vesical
junction.
2. Artifact versus tiny left upper pole nonobstructing renal calculi
(series 8, image 141 and series 6, image 110).
3. Slightly elongated liver spanning over 18.7 cm.

## 2020-05-19 ENCOUNTER — Ambulatory Visit: Payer: BC Managed Care – PPO | Admitting: Physician Assistant

## 2020-05-22 ENCOUNTER — Ambulatory Visit: Payer: BC Managed Care – PPO | Admitting: Physician Assistant

## 2020-05-27 ENCOUNTER — Other Ambulatory Visit: Payer: Self-pay | Admitting: Gastroenterology

## 2020-06-02 ENCOUNTER — Other Ambulatory Visit (HOSPITAL_COMMUNITY)
Admission: RE | Admit: 2020-06-02 | Discharge: 2020-06-02 | Disposition: A | Discharge status: Home / Self Care | Payer: 59 | Source: Ambulatory Visit | Attending: Physician Assistant | Admitting: Physician Assistant

## 2020-06-02 ENCOUNTER — Other Ambulatory Visit: Payer: Self-pay

## 2020-06-02 ENCOUNTER — Encounter: Payer: Self-pay | Admitting: Physician Assistant

## 2020-06-02 ENCOUNTER — Ambulatory Visit: Payer: 59 | Admitting: Physician Assistant

## 2020-06-02 VITALS — BP 116/60 | HR 89 | Temp 98.4°F | Ht 72.0 in | Wt 220.2 lb

## 2020-06-02 DIAGNOSIS — F321 Major depressive disorder, single episode, moderate: Secondary | ICD-10-CM

## 2020-06-02 DIAGNOSIS — R41840 Attention and concentration deficit: Secondary | ICD-10-CM | POA: Diagnosis not present

## 2020-06-02 DIAGNOSIS — F419 Anxiety disorder, unspecified: Secondary | ICD-10-CM

## 2020-06-02 DIAGNOSIS — Z7251 High risk heterosexual behavior: Secondary | ICD-10-CM

## 2020-06-02 MED ORDER — EMTRICITABINE-TENOFOVIR DF 200-300 MG PO TABS: 1.0000 | ORAL_TABLET | Freq: Every day | ORAL | 0 refills | Status: DC

## 2020-06-02 MED ORDER — FLUOXETINE HCL 10 MG PO CAPS: 10.0000 mg | ORAL_CAPSULE | Freq: Every day | ORAL | 1 refills | Status: DC

## 2020-06-02 NOTE — Progress Notes (Signed)
Calvin Guerrero is a 25 y.o. male here for a new problem.  I acted as a Neurosurgeon for [employer], PA-C Corky Mull, LPN   History of Present Illness:   Chief Complaint  Patient presents with  . Anxiety  . Depression    HPI   Anxiety / Depression Pt c/o increase in anxiety and depression for the past 2-3 months. Depression worse than anxiety especially since the pandemic. Pt would like to see a therapist. Has never been truly diagnosed and never on medication. Denies SI/HI. Has concerns for underlying ADHD and would like to pursue testing for this. Seasonal depression is a concern for him as well.  Anxiety is increased during the day typically. Has had poor eating habits. Has never been on medications. Has had some suicidal thoughts last week, prior to vacation. Denies plan.  Best friend Idalia Needle is biggest support person right now.  Depression screen [facility] 2/9 06/02/2020 12/17/2019  Decreased Interest 2 1  Down, Depressed, Hopeless 3 1  PHQ - 2 Score 5 2  Altered sleeping 1 1  Tired, decreased energy 2 2  Change in appetite 3 3  Feeling bad or failure about yourself  2 1  Trouble concentrating 3 1  Moving slowly or fidgety/restless 0 0  Suicidal thoughts 1 0  PHQ-9 Score 17 10  Difficult doing work/chores Very difficult Somewhat difficult    PREP use Has used Truvada in the past and did well with this but his formulary is requiring [place]. He trialed this but it caused worsening GI symptoms. He would like to go back to Truvada. Unfortunately we do not have his baseline PREP labs to review together today.   Past Medical History:  Diagnosis Date  . Allergy   . Asthma    as a child     Social History   Tobacco Use  . Smoking status: Former Smoker    Types: Cigarettes  . Smokeless tobacco: Never Used  . Tobacco comment: quit 04/2018  Vaping Use  . Vaping Use: Never used  Substance Use Topics  . Alcohol use: Yes    Comment: socially  . Drug use: Yes    Frequency:  4.0 times per week    Types: Marijuana    Past Surgical History:  Procedure Laterality Date  . NO PAST SURGERIES      Family History  Problem Relation Age of Onset  . Asthma Mother   . Thyroid cancer Mother   . Gestational diabetes Mother   . Depression Mother   . Hearing loss Father   . Depression Sister   . Ovarian cancer Paternal Grandmother   . Colon cancer Neg Hx   . Prostate cancer Neg Hx   . Breast cancer Neg Hx     No Known Allergies  Current Medications:   Current Outpatient Medications:  .  bismuth subsalicylate (PEPTO BISMOL) 262 MG chewable tablet, Chew 524 mg by mouth as needed., Disp: , Rfl:  .  cetirizine (ZYRTEC) 10 MG tablet, Take by mouth., Disp: , Rfl:  .  emtricitabine-tenofovir (TRUVADA) 200-300 MG tablet, Take 1 tablet by mouth daily., Disp: 90 tablet, Rfl: 0 .  FLUoxetine (PROZAC) 10 MG capsule, Take 1 capsule (10 mg total) by mouth daily., Disp: 30 capsule, Rfl: 1 .  hyoscyamine (LEVSIN SL) 0.125 MG SL tablet, Place 1 tablet (0.125 mg total) under the tongue every 6 (six) hours as needed., Disp: 30 tablet, Rfl: 1 .  naproxen sodium (ALEVE) 220 MG tablet, Take  220 mg by mouth daily as needed., Disp: , Rfl:  .  pantoprazole (PROTONIX) 40 MG tablet, TAKE 1 TABLET BY MOUTH EVERY DAY, Disp: 90 tablet, Rfl: 1   Review of Systems:   ROS Negative unless otherwise specified per HPI.  Vitals:   Vitals:   06/02/20 1432  BP: 116/60  Pulse: 89  Temp: 98.4 F (36.9 C)  TempSrc: Temporal  SpO2: 98%  Weight: 220 lb 4 oz (99.9 kg)  Height: 6' (1.829 m)     Body mass index is 29.87 kg/m.  Physical Exam:   Physical Exam Vitals and nursing note reviewed.  Constitutional:      General: He is not in acute distress.    Appearance: He is well-developed. He is not ill-appearing, toxic-appearing or sickly-appearing.  Cardiovascular:     Rate and Rhythm: Normal rate and regular rhythm.     Pulses: Normal pulses.     Heart sounds: Normal heart sounds, S1  normal and S2 normal.     Comments: No LE edema Pulmonary:     Effort: Pulmonary effort is normal.     Breath sounds: Normal breath sounds.  Skin:    General: Skin is warm, dry and intact.  Neurological:     Mental Status: He is alert.     GCS: GCS eye subscore is 4. GCS verbal subscore is 5. GCS motor subscore is 6.  Psychiatric:        Mood and Affect: Mood and affect normal.        Speech: Speech normal.        Behavior: Behavior normal. Behavior is cooperative.      Assessment and Plan:   [name] was seen today for anxiety and depression.  Diagnoses and all orders for this visit:  Attention or concentration deficit; Anxiety; Depression, major, single episode, moderate (HCC) Uncontrolled. I have strong suspicion for underlying ADHD.  Will refer to [facility] for evaluation.  I did discuss with him that should he get evaluated and he would like management from this office that we could take over if indicated. Going to start Prozac 10 mg daily for patient. I discussed with patient that if they develop any SI, to tell someone immediately and seek medical attention. Follow-up with me in 4 to 6 weeks for further assessment of mood on Prozac 10 mg daily. -     Comprehensive metabolic panel -     Ambulatory referral to Psychiatry  High risk sexual behavior, unspecified type We will order baseline testing even though he has been on this for a while because I cannot review those records today.  He verbalized understanding to this. We will have him start Truvada if insurance allows, after testing has returned. Patient verbalizes understanding to plan and is in agreement. -     HIV Antibody (routine testing w rflx) -     Urine cytology ancillary only([facility]) -     Hepatitis B Surface AntiGEN -     Hepatitis B surface antibody,quantitative -     Hepatitis B core antibody, total -     RPR  Other orders -     FLUoxetine (PROZAC) 10 MG capsule; Take 1 capsule  (10 mg total) by mouth daily. -     emtricitabine-tenofovir (TRUVADA) 200-300 MG tablet; Take 1 tablet by mouth daily.  CMA or LPN served as scribe during this visit. History, Physical, and Plan performed by medical provider. The above documentation has been reviewed and is accurate  and complete.   Inda Coke, PA-C

## 2020-06-02 NOTE — Patient Instructions (Signed)
It was great to see you!  Tell someone you trust about starting prozac 10 mg daily. Follow-up with me in 4-6 weeks so I know how this is going, sooner if concerns.  You will be contacted about referral to [facility].  Address: [address] [place], [place], [place] [address] Phone: [phone]  If a referral was placed today, you will be contacted for an appointment. Please note that routine referrals can sometimes take up to 3-4 weeks to process. Please call our office if you haven't heard anything after this time frame.  Blood work and urine today to start Truvada.   Take care,  Jarold Motto PA-C

## 2020-06-03 LAB — RPR: RPR Ser Ql: NONREACTIVE

## 2020-06-03 LAB — COMPREHENSIVE METABOLIC PANEL
ALT: 26 U/L (ref 0–53)
AST: 20 U/L (ref 0–37)
Albumin: 4.8 g/dL (ref 3.5–5.2)
Alkaline Phosphatase: 58 U/L (ref 39–117)
BUN: 17 mg/dL (ref 6–23)
CO2: 30 mEq/L (ref 19–32)
Calcium: 10 mg/dL (ref 8.4–10.5)
Chloride: 101 mEq/L (ref 96–112)
Creatinine, Ser: 0.88 mg/dL (ref 0.40–1.50)
GFR: 120.26 mL/min (ref >60.00)
Glucose, Bld: 82 mg/dL (ref 70–99)
Potassium: 4.1 mEq/L (ref 3.5–5.1)
Sodium: 138 mEq/L (ref 135–145)
Total Bilirubin: 0.8 mg/dL (ref 0.2–1.2)
Total Protein: 8.3 g/dL (ref 6.0–8.3)

## 2020-06-03 LAB — HEPATITIS B SURFACE ANTIBODY, QUANTITATIVE: Hep B S AB Quant (Post): <5 m[IU]/mL — ABNORMAL LOW (ref >=10)

## 2020-06-03 LAB — HIV ANTIBODY (ROUTINE TESTING W REFLEX): HIV 1&2 Ab, 4th Generation: NONREACTIVE

## 2020-06-03 LAB — HEPATITIS B CORE ANTIBODY, TOTAL: Hep B Core Total Ab: NONREACTIVE

## 2020-06-03 LAB — HEPATITIS B SURFACE ANTIGEN: Hepatitis B Surface Ag: NONREACTIVE

## 2020-06-04 LAB — URINE CYTOLOGY ANCILLARY ONLY
Chlamydia: NEGATIVE
Comment: NEGATIVE
Comment: NEGATIVE
Comment: NORMAL
Neisseria Gonorrhea: NEGATIVE
Trichomonas: NEGATIVE

## 2020-06-24 ENCOUNTER — Other Ambulatory Visit: Payer: Self-pay | Admitting: Physician Assistant

## 2020-07-20 ENCOUNTER — Other Ambulatory Visit: Payer: Self-pay | Admitting: Physician Assistant

## 2020-08-19 ENCOUNTER — Other Ambulatory Visit: Payer: Self-pay | Admitting: Physician Assistant

## 2020-09-15 ENCOUNTER — Other Ambulatory Visit: Payer: Self-pay | Admitting: Physician Assistant

## 2020-10-09 ENCOUNTER — Other Ambulatory Visit: Payer: Self-pay | Admitting: Family Medicine

## 2020-11-15 ENCOUNTER — Other Ambulatory Visit: Payer: Self-pay | Admitting: Family Medicine

## 2020-12-03 ENCOUNTER — Other Ambulatory Visit: Payer: Self-pay | Admitting: Gastroenterology

## 2020-12-09 ENCOUNTER — Other Ambulatory Visit: Payer: Self-pay | Admitting: Physician Assistant

## 2021-01-05 ENCOUNTER — Encounter: Payer: Self-pay | Admitting: Physician Assistant

## 2021-01-05 ENCOUNTER — Other Ambulatory Visit: Payer: Self-pay

## 2021-01-05 ENCOUNTER — Ambulatory Visit: Payer: 59 | Admitting: Physician Assistant

## 2021-01-05 ENCOUNTER — Other Ambulatory Visit (HOSPITAL_COMMUNITY)
Admission: RE | Admit: 2021-01-05 | Discharge: 2021-01-05 | Disposition: A | Discharge status: Home / Self Care | Payer: 59 | Source: Ambulatory Visit | Attending: Physician Assistant | Admitting: Physician Assistant

## 2021-01-05 VITALS — BP 116/70 | HR 84 | Temp 98.3°F | Ht 72.0 in | Wt 219.4 lb

## 2021-01-05 DIAGNOSIS — Z7251 High risk heterosexual behavior: Secondary | ICD-10-CM | POA: Insufficient documentation

## 2021-01-05 DIAGNOSIS — R41840 Attention and concentration deficit: Secondary | ICD-10-CM

## 2021-01-05 DIAGNOSIS — F419 Anxiety disorder, unspecified: Secondary | ICD-10-CM | POA: Diagnosis not present

## 2021-01-05 DIAGNOSIS — F321 Major depressive disorder, single episode, moderate: Secondary | ICD-10-CM

## 2021-01-05 NOTE — Patient Instructions (Signed)
It was great to see you!  Please ask your psychiatrist about screening you or connecting you with someone to screen you for ADHD. Bring those results to me if you'd like treatment.  I recommend getting the HPV vaccine. We can do this here if you'd like to pursue this.  After I get your results today, I will send in 3 months of truvada.  Let's follow-up in 3 months, sooner if you have concerns.  If a referral was placed today, you will be contacted for an appointment. Please note that routine referrals can sometimes take up to 3-4 weeks to process. Please call our office if you haven't heard anything after this time frame.  Take care,  Jarold Motto PA-C

## 2021-01-05 NOTE — Progress Notes (Signed)
Calvin Guerrero is a 25 y.o. male is here for follow up.  I acted as a Neurosurgeon for [employer], PA-C Corky Mull, LPN   History of Present Illness:   Chief Complaint  Patient presents with   Anxiety   Depression   High risk sexual behavior    Moving to [place] at the end of this month.   HPI  Anxiety / Depression Pt following up, currently taking Prozac 20 mg daily. Tolerating medication well. Denies SI/HI. Anxiety has increased due to planning to move. He does continue to have concerns about possible ADHD. Has not seen a specialist to dx this.   High risk sexual behavior Pt here for f/u needs STD testing for refill on Truvada. Tolerating medication well. Denies current symptoms of penile discharge, unintentional weight loss, dysuria.   Health Maintenance Due  Topic Date Due   HPV VACCINES (1 - Male 2-dose series) Never done   COVID-19 Vaccine (3 - Booster for [employer] series) 12/23/2019    Past Medical History:  Diagnosis Date   Allergy    Asthma    as a child     Social History   Tobacco Use   Smoking status: Former    Types: Cigarettes   Smokeless tobacco: Never   Tobacco comments:    quit 04/2018  Vaping Use   Vaping Use: Never used  Substance Use Topics   Alcohol use: Yes    Comment: socially   Drug use: Yes    Frequency: 4.0 times per week    Types: Marijuana    Past Surgical History:  Procedure Laterality Date   NO PAST SURGERIES      Family History  Problem Relation Age of Onset   Asthma Mother    Thyroid cancer Mother    Gestational diabetes Mother    Depression Mother    Hearing loss Father    Depression Sister    Ovarian cancer Paternal Grandmother    Colon cancer Neg Hx    Prostate cancer Neg Hx    Breast cancer Neg Hx     PMHx, SurgHx, SocialHx, FamHx, Medications, and Allergies were reviewed in the Visit Navigator and updated as appropriate.   Patient Active Problem List   Diagnosis Date Noted   Generalized abdominal  pain 01/28/2020   Belching 01/28/2020   Altered bowel habits 01/28/2020    Social History   Tobacco Use   Smoking status: Former    Types: Cigarettes   Smokeless tobacco: Never   Tobacco comments:    quit 04/2018  Vaping Use   Vaping Use: Never used  Substance Use Topics   Alcohol use: Yes    Comment: socially   Drug use: Yes    Frequency: 4.0 times per week    Types: Marijuana    Current Medications and Allergies:    Current Outpatient Medications:    bismuth subsalicylate (PEPTO BISMOL) 262 MG chewable tablet, Chew 524 mg by mouth as needed., Disp: , Rfl:    cetirizine (ZYRTEC) 10 MG tablet, Take by mouth., Disp: , Rfl:    emtricitabine-tenofovir (TRUVADA) 200-300 MG tablet, TAKE 1 TABLET BY MOUTH EVERY DAY, Disp: 30 tablet, Rfl: 0   FLUoxetine (PROZAC) 20 MG capsule, Take 20 mg by mouth daily., Disp: , Rfl:    hyoscyamine (LEVSIN SL) 0.125 MG SL tablet, Place 1 tablet (0.125 mg total) under the tongue every 6 (six) hours as needed., Disp: 30 tablet, Rfl: 1   naproxen sodium (ALEVE) 220 MG  tablet, Take 220 mg by mouth daily as needed., Disp: , Rfl:    pantoprazole (PROTONIX) 40 MG tablet, TAKE 1 TABLET BY MOUTH EVERY DAY, Disp: 90 tablet, Rfl: 1  No Known Allergies  Review of Systems   ROS Negative unless otherwise specified per HPI.  Vitals:   Vitals:   01/05/21 1014  BP: 116/70  Pulse: 84  Temp: 98.3 F (36.8 C)  TempSrc: Temporal  SpO2: 98%  Weight: 219 lb 6.1 oz (99.5 kg)  Height: 6' (1.829 m)     Body mass index is 29.75 kg/m.   Physical Exam:    Physical Exam Vitals and nursing note reviewed.  Constitutional:      General: He is not in acute distress.    Appearance: He is well-developed. He is not ill-appearing or toxic-appearing.  Cardiovascular:     Rate and Rhythm: Normal rate and regular rhythm.     Pulses: Normal pulses.     Heart sounds: Normal heart sounds, S1 normal and S2 normal.     Comments: No LE edema Pulmonary:     Effort:  Pulmonary effort is normal.     Breath sounds: Normal breath sounds.  Skin:    General: Skin is warm and dry.  Neurological:     Mental Status: He is alert.     GCS: GCS eye subscore is 4. GCS verbal subscore is 5. GCS motor subscore is 6.  Psychiatric:        Speech: Speech normal.        Behavior: Behavior normal. Behavior is cooperative.     Assessment and Plan:   1. High risk sexual behavior, unspecified type Update STD testing today If results are normal will refill Truvada for 3 months Recommend continuing safe sex practices  2. Anxiety Management per psychiatry  3. Depression, major, single episode, moderate (HCC) Management per psychiatry  4.  Attention or concentration deficit Patient is going to discuss with his psychiatrist regarding evaluation for this  CMA or LPN served as scribe during this visit. History, Physical, and Plan performed by medical provider. The above documentation has been reviewed and is accurate and complete.   Jarold Motto, PA-C [place] 01/05/2021  Follow-up: No follow-ups on file.

## 2021-01-06 ENCOUNTER — Other Ambulatory Visit: Payer: Self-pay | Admitting: Physician Assistant

## 2021-01-06 LAB — HIV ANTIBODY (ROUTINE TESTING W REFLEX): HIV 1&2 Ab, 4th Generation: NONREACTIVE

## 2021-01-06 LAB — URINE CYTOLOGY ANCILLARY ONLY
Chlamydia: NEGATIVE
Comment: NEGATIVE
Comment: NEGATIVE
Comment: NORMAL
Neisseria Gonorrhea: NEGATIVE
Trichomonas: NEGATIVE

## 2021-01-06 LAB — RPR: RPR Ser Ql: NONREACTIVE

## 2021-01-06 MED ORDER — EMTRICITABINE-TENOFOVIR DF 200-300 MG PO TABS: 1.0000 | ORAL_TABLET | Freq: Every day | ORAL | 0 refills | Status: DC

## 2021-04-08 ENCOUNTER — Other Ambulatory Visit: Payer: Self-pay | Admitting: Physician Assistant

## 2021-04-22 ENCOUNTER — Other Ambulatory Visit: Payer: Self-pay | Admitting: Physician Assistant

## 2021-06-07 ENCOUNTER — Other Ambulatory Visit: Payer: Self-pay | Admitting: Gastroenterology

## 2021-11-12 DIAGNOSIS — R7309 Other abnormal glucose: Secondary | ICD-10-CM | POA: Diagnosis not present

## 2021-11-12 DIAGNOSIS — Z0001 Encounter for general adult medical examination with abnormal findings: Secondary | ICD-10-CM | POA: Diagnosis not present

## 2021-11-12 DIAGNOSIS — Z0000 Encounter for general adult medical examination without abnormal findings: Secondary | ICD-10-CM | POA: Diagnosis not present

## 2021-11-15 DIAGNOSIS — Z1389 Encounter for screening for other disorder: Secondary | ICD-10-CM | POA: Diagnosis not present

## 2021-11-15 DIAGNOSIS — Z0000 Encounter for general adult medical examination without abnormal findings: Secondary | ICD-10-CM | POA: Diagnosis not present

## 2021-11-15 DIAGNOSIS — Z1331 Encounter for screening for depression: Secondary | ICD-10-CM | POA: Diagnosis not present

## 2021-12-13 DIAGNOSIS — F411 Generalized anxiety disorder: Secondary | ICD-10-CM | POA: Diagnosis not present

## 2021-12-13 DIAGNOSIS — R748 Abnormal levels of other serum enzymes: Secondary | ICD-10-CM | POA: Diagnosis not present

## 2021-12-13 DIAGNOSIS — F509 Eating disorder, unspecified: Secondary | ICD-10-CM | POA: Diagnosis not present

## 2021-12-13 DIAGNOSIS — Z713 Dietary counseling and surveillance: Secondary | ICD-10-CM | POA: Diagnosis not present
